# Patient Record
Sex: Female | Born: 1965 | Race: White | Hispanic: No | Marital: Married | Smoking: Never smoker
Health system: Southern US, Community
[De-identification: ages and names within clinical notes are randomized; demographics above are authoritative.]

## PROBLEM LIST (undated history)

## (undated) DIAGNOSIS — I6501 Occlusion and stenosis of right vertebral artery: Secondary | ICD-10-CM

## (undated) HISTORY — PX: APPENDECTOMY: SHX54

## (undated) HISTORY — PX: CHOLECYSTECTOMY: SHX55

## (undated) HISTORY — PX: CERVICAL SPINE SURGERY: SHX589

---

## 2021-08-10 ENCOUNTER — Encounter (HOSPITAL_COMMUNITY): Payer: Self-pay | Admitting: Emergency Medicine

## 2021-08-10 ENCOUNTER — Other Ambulatory Visit: Payer: Self-pay

## 2021-08-10 ENCOUNTER — Emergency Department (HOSPITAL_COMMUNITY): Payer: No Typology Code available for payment source

## 2021-08-10 ENCOUNTER — Emergency Department (HOSPITAL_COMMUNITY)
Admission: EM | Admit: 2021-08-10 | Discharge: 2021-08-10 | Disposition: A | Discharge status: Home / Self Care | Payer: No Typology Code available for payment source | Attending: Emergency Medicine | Admitting: Emergency Medicine

## 2021-08-10 DIAGNOSIS — Z7982 Long term (current) use of aspirin: Secondary | ICD-10-CM | POA: Diagnosis not present

## 2021-08-10 DIAGNOSIS — R42 Dizziness and giddiness: Secondary | ICD-10-CM | POA: Diagnosis not present

## 2021-08-10 DIAGNOSIS — I639 Cerebral infarction, unspecified: Secondary | ICD-10-CM | POA: Insufficient documentation

## 2021-08-10 DIAGNOSIS — R531 Weakness: Secondary | ICD-10-CM | POA: Diagnosis present

## 2021-08-10 DIAGNOSIS — Z7902 Long term (current) use of antithrombotics/antiplatelets: Secondary | ICD-10-CM | POA: Diagnosis not present

## 2021-08-10 DIAGNOSIS — R29898 Other symptoms and signs involving the musculoskeletal system: Secondary | ICD-10-CM

## 2021-08-10 HISTORY — DX: Occlusion and stenosis of right vertebral artery: I65.01

## 2021-08-10 LAB — COMPREHENSIVE METABOLIC PANEL
ALT: 32 U/L (ref 0–44)
AST: 23 U/L (ref 15–41)
Albumin: 4.3 g/dL (ref 3.5–5.0)
Alkaline Phosphatase: 76 U/L (ref 38–126)
Anion gap: 9 (ref 5–15)
BUN: 13 mg/dL (ref 6–20)
CO2: 26 mmol/L (ref 22–32)
Calcium: 9.6 mg/dL (ref 8.9–10.3)
Chloride: 104 mmol/L (ref 98–111)
Creatinine, Ser: 0.87 mg/dL (ref 0.44–1.00)
GFR, Estimated: >60 mL/min (ref >60)
Glucose, Bld: 106 mg/dL — ABNORMAL HIGH (ref 70–99)
Potassium: 4.1 mmol/L (ref 3.5–5.1)
Sodium: 139 mmol/L (ref 135–145)
Total Bilirubin: 0.6 mg/dL (ref 0.3–1.2)
Total Protein: 7.4 g/dL (ref 6.5–8.1)

## 2021-08-10 LAB — I-STAT CHEM 8, ED
BUN: 14 mg/dL (ref 6–20)
Calcium, Ion: 1.15 mmol/L (ref 1.15–1.40)
Chloride: 103 mmol/L (ref 98–111)
Creatinine, Ser: 0.8 mg/dL (ref 0.44–1.00)
Glucose, Bld: 105 mg/dL — ABNORMAL HIGH (ref 70–99)
HCT: 45 % (ref 36.0–46.0)
Hemoglobin: 15.3 g/dL — ABNORMAL HIGH (ref 12.0–15.0)
Potassium: 4 mmol/L (ref 3.5–5.1)
Sodium: 140 mmol/L (ref 135–145)
TCO2: 25 mmol/L (ref 22–32)

## 2021-08-10 LAB — CBC
HCT: 45 % (ref 36.0–46.0)
Hemoglobin: 15.1 g/dL — ABNORMAL HIGH (ref 12.0–15.0)
MCH: 28.2 pg (ref 26.0–34.0)
MCHC: 33.6 g/dL (ref 30.0–36.0)
MCV: 84.1 fL (ref 80.0–100.0)
Platelets: 232 10*3/uL (ref 150–400)
RBC: 5.35 MIL/uL — ABNORMAL HIGH (ref 3.87–5.11)
RDW: 12.4 % (ref 11.5–15.5)
WBC: 6.9 10*3/uL (ref 4.0–10.5)
nRBC: 0 % (ref 0.0–0.2)

## 2021-08-10 LAB — DIFFERENTIAL
Abs Immature Granulocytes: 0.02 10*3/uL (ref 0.00–0.07)
Basophils Absolute: 0 10*3/uL (ref 0.0–0.1)
Basophils Relative: 0 %
Eosinophils Absolute: 0 10*3/uL (ref 0.0–0.5)
Eosinophils Relative: 1 %
Immature Granulocytes: 0 %
Lymphocytes Relative: 30 %
Lymphs Abs: 2.1 10*3/uL (ref 0.7–4.0)
Monocytes Absolute: 0.3 10*3/uL (ref 0.1–1.0)
Monocytes Relative: 5 %
Neutro Abs: 4.4 10*3/uL (ref 1.7–7.7)
Neutrophils Relative %: 64 %

## 2021-08-10 LAB — PROTIME-INR
INR: 1 (ref 0.8–1.2)
Prothrombin Time: 13.1 seconds (ref 11.4–15.2)

## 2021-08-10 LAB — APTT: aPTT: 33 seconds (ref 24–36)

## 2021-08-10 LAB — I-STAT BETA HCG BLOOD, ED (MC, WL, AP ONLY): I-stat hCG, quantitative: <5 m[IU]/mL (ref <5)

## 2021-08-10 LAB — TROPONIN I (HIGH SENSITIVITY): Troponin I (High Sensitivity): <2 ng/L (ref <18)

## 2021-08-10 MED ORDER — SODIUM CHLORIDE 0.9% FLUSH: 3.0000 mL | Freq: Once | INTRAVENOUS | Status: DC

## 2021-08-10 NOTE — Discharge Instructions (Signed)
Follow-up with your neurologist, as scheduled.  Return to the ED for any further concerning symptoms.

## 2021-08-10 NOTE — ED Notes (Signed)
Pt did not respond to vital check  

## 2021-08-10 NOTE — ED Triage Notes (Signed)
Pt reports L sided weakness since waking up at 3am.  Went to bed at midnight.  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  Seen at [facility] and [place].   Reports ongoing dizziness and headache since then.  No arm drift.  VAN negative.  Went to [place] this morning and states she had a normal CT.

## 2021-08-10 NOTE — ED Provider Notes (Signed)
[facility] EMERGENCY DEPARTMENT Provider Note   CSN: [id number] Arrival date & time: 08/10/21  1030     History Chief Complaint  Patient presents with   Weakness    Anatalia Kronk is a 55 y.o. female.   Weakness Associated symptoms: no abdominal pain, no arthralgias, no chest pain, no cough, no dizziness, no dysuria, no fever, no myalgias, no nausea, no seizures, no shortness of breath and no vomiting   Patient presents for left arm weakness.  She had a recent diagnosis of vertebral artery stenosis following the cute onset of dizziness and lightheadedness.  This was 4 days ago.  She presented to her local ED at the time and subsequently was admitted at [facility].  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  No interventions were taken by IR.  She was placed on DAPT advised to follow-up with neurology.  She had resolution of her symptoms.  This morning, she had onset of left arm weakness at 3 AM.  She went to ED in [place] and underwent a CT scan.  This was reportedly normal.  She presents to the ED for further evaluation/imaging.  Currently, she reports improved symptoms of left arm weakness.  She states that still feels slightly heavy.  She denies any chest discomfort.  She has no history of ACS.    Past Medical History:  Diagnosis Date   Stenosis of right vertebral artery     There are no problems to display for this patient.   Past Surgical History:  Procedure Laterality Date   APPENDECTOMY     CERVICAL SPINE SURGERY     CHOLECYSTECTOMY       OB History   No obstetric history on file.     No family history on file.  Social History   Tobacco Use   Smoking status: Never   Smokeless tobacco: Never  Substance Use Topics   Alcohol use: Not Currently   Drug use: Not Currently    Home Medications Prior to Admission medications   Medication Sig Start Date End Date Taking? Authorizing Provider  aspirin  EC 81 MG tablet Take 81 mg by mouth daily. Swallow whole.   Yes [provider]  clopidogrel (PLAVIX) 75 MG tablet Take 75 mg by mouth daily. 08/09/21  Yes [provider]  meclizine (ANTIVERT) 25 MG tablet Take 25 mg by mouth 3 (three) times daily as needed for dizziness. 08/09/21   [provider]    Allergies    Patient has no known allergies.  Review of Systems   Review of Systems  Constitutional:  Negative for activity change, appetite change, chills, fatigue and fever.  HENT:  Negative for congestion, ear pain, rhinorrhea and sore throat.   Eyes:  Negative for pain and visual disturbance.  Respiratory:  Negative for cough, chest tightness and shortness of breath.   Cardiovascular:  Negative for chest pain and palpitations.  Gastrointestinal:  Negative for abdominal pain, nausea and vomiting.  Genitourinary:  Negative for dysuria, flank pain and hematuria.  Musculoskeletal:  Negative for arthralgias, back pain, joint swelling, myalgias and neck pain.  Skin:  Negative for color change and rash.  Neurological:  Positive for weakness (Left arm). Negative for dizziness, seizures, syncope, light-headedness and numbness.  Psychiatric/Behavioral:  Negative for confusion and decreased concentration.   All other systems reviewed and are negative.  Physical Exam Updated Vital Signs BP (!) 117/93   Pulse 65   Temp 98.1 F (  36.7 C)   Resp 16   SpO2 98%   Physical Exam Vitals and nursing note reviewed.  Constitutional:      General: She is not in acute distress.    Appearance: Normal appearance. She is well-developed and normal weight. She is not ill-appearing, toxic-appearing or diaphoretic.  HENT:     Head: Normocephalic and atraumatic.     Right Ear: External ear normal.     Left Ear: External ear normal.     Nose: Nose normal. No congestion or rhinorrhea.     Mouth/Throat:     Mouth: Mucous membranes are moist.     Pharynx: Oropharynx is clear.   Eyes:     Extraocular Movements: Extraocular movements intact.     Conjunctiva/sclera: Conjunctivae normal.  Cardiovascular:     Rate and Rhythm: Normal rate and regular rhythm.     Heart sounds: No murmur heard. Pulmonary:     Effort: Pulmonary effort is normal. No respiratory distress.     Breath sounds: Normal breath sounds.  Abdominal:     Palpations: Abdomen is soft.     Tenderness: There is no abdominal tenderness.  Musculoskeletal:        General: Normal range of motion.     Cervical back: Normal range of motion and neck supple.     Right lower leg: No edema.     Left lower leg: No edema.  Skin:    General: Skin is warm and dry.     Coloration: Skin is not jaundiced or pale.  Neurological:     Mental Status: She is alert and oriented to person, place, and time.     Cranial Nerves: No cranial nerve deficit.     Sensory: Sensory deficit (Endorses slightly diminished sensation on left arm) present.     Motor: No weakness.     Coordination: Coordination normal.  Psychiatric:        Mood and Affect: Mood normal.        Behavior: Behavior normal.        Thought Content: Thought content normal.        Judgment: Judgment normal.    ED Results / Procedures / Treatments   Labs (all labs ordered are listed, but only abnormal results are displayed) Labs Reviewed  CBC - Abnormal; Notable for the following components:      Result Value   RBC 5.35 (*)    Hemoglobin 15.1 (*)    All other components within normal limits  COMPREHENSIVE METABOLIC PANEL - Abnormal; Notable for the following components:   Glucose, Bld 106 (*)    All other components within normal limits  I-STAT CHEM 8, ED - Abnormal; Notable for the following components:   Glucose, Bld 105 (*)    Hemoglobin 15.3 (*)    All other components within normal limits  PROTIME-INR  APTT  DIFFERENTIAL  CBG MONITORING, ED  I-STAT BETA HCG BLOOD, ED ([facility], [facility], [facility] ONLY)  TROPONIN I (HIGH SENSITIVITY)  TROPONIN I (HIGH  SENSITIVITY)    EKG EKG Interpretation  Date/Time:  Sunday August 10 2021 10:42:44 EST Ventricular Rate:  84 PR Interval:  152 QRS Duration: 92 QT Interval:  384 QTC Calculation: 453 R Axis:   39 Text Interpretation: Normal sinus rhythm Possible Anterior infarct , age undetermined Abnormal ECG Confirmed by Godfrey Pick [phone]) on 08/10/2021 2:33:43 PM  Radiology MR ANGIO HEAD WO CONTRAST  Result Date: 08/10/2021 CLINICAL DATA:  Neuro deficit, acute, stroke suspected 55 y.o. female  was evaluated in triage. Patient was admitted to [facility] for a right-sided vertebral artery dissection on Friday. She was placed on DAPT and discharged home with instructions to return to the emergency department with any signs of neurologic deficits. This morning she felt as though the right side of her body had "an ice bucket jumped on it." Also felt some weakness in her left upper extremity. EXAM: MRI HEAD WITHOUT CONTRAST MRA HEAD WITHOUT CONTRAST MRA NECK WITHOUT CONTRAST TECHNIQUE: Multiplanar, multiecho pulse sequences of the brain and surrounding structures were obtained without intravenous contrast. Angiographic images of the Circle of Willis were obtained using MRA technique without intravenous contrast. Angiographic images of the neck were obtained using MRA technique without intravenous contrast. Carotid stenosis measurements (when applicable) are obtained utilizing NASCET criteria, using the distal internal carotid diameter as the denominator. COMPARISON:  None. FINDINGS: MRI HEAD FINDINGS Brain: No acute infarction, hemorrhage, hydrocephalus, extra-axial collection or mass lesion. Vascular: See below. Skull and upper cervical spine: Normal marrow signal. Sinuses/Orbits: Clear sinuses.  Unremarkable orbits. Other: No mastoid effusions. MRA HEAD FINDINGS Anterior circulation: Bilateral intracranial ICAs, MCAs, and ACAs are patent without proximal hemodynamically significant stenosis. No aneurysm  identified. Posterior circulation: The visualized intradural vertebral arteries, basilar artery, and posterior cerebral arteries are patent without proximal hemodynamically significant stenosis. Left fetal type PCA, anatomic variant. Bilateral superior cerebellar arteries and visualized PICAs are patent. Tiny infundibulum arising from the left superior cerebellar artery proximally with faintly visible tiny vessel arising from the tip. No aneurysm identified. Right posterior communicating artery is also present. MRA NECK FINDINGS Carotid system: Bilateral carotids are patent without Vertebral arteries: Bilateral vertebral arteries demonstrate normal flow related signal. No evidence of significant (greater than 50%) stenosis or significant irregularity to suggest dissection. IMPRESSION: MRI head: No evidence of acute abnormality, including acute infarct. MRA head: No large vessel occlusion or significant stenosis. MRA neck: 1. No evidence of significant (greater than 50%) stenosis. 2. Bilateral vertebral arteries demonstrate normal flow related signal without significant irregularity to suggest dissection on this noncontrast MRA. A CTA neck could provide more sensitive evaluation for subtle findings of dissection if clinically indicated. Additionally, comparison/correlation with outside imaging may be helpful if available. Electronically Signed   By: Margaretha Sheffield M.D.   On: 08/10/2021 13:22   MR ANGIO NECK WO CONTRAST  Result Date: 08/10/2021 CLINICAL DATA:  Neuro deficit, acute, stroke suspected 55 y.o. female was evaluated in triage. Patient was admitted to [facility] for a right-sided vertebral artery dissection on Friday. She was placed on DAPT and discharged home with instructions to return to the emergency department with any signs of neurologic deficits. This morning she felt as though the right side of her body had "an ice bucket jumped on it." Also felt some weakness in her left upper  extremity. EXAM: MRI HEAD WITHOUT CONTRAST MRA HEAD WITHOUT CONTRAST MRA NECK WITHOUT CONTRAST TECHNIQUE: Multiplanar, multiecho pulse sequences of the brain and surrounding structures were obtained without intravenous contrast. Angiographic images of the Circle of Willis were obtained using MRA technique without intravenous contrast. Angiographic images of the neck were obtained using MRA technique without intravenous contrast. Carotid stenosis measurements (when applicable) are obtained utilizing NASCET criteria, using the distal internal carotid diameter as the denominator. COMPARISON:  None. FINDINGS: MRI HEAD FINDINGS Brain: No acute infarction, hemorrhage, hydrocephalus, extra-axial collection or mass lesion. Vascular: See below. Skull and upper cervical spine: Normal marrow signal. Sinuses/Orbits: Clear sinuses.  Unremarkable orbits. Other: No mastoid  effusions. MRA HEAD FINDINGS Anterior circulation: Bilateral intracranial ICAs, MCAs, and ACAs are patent without proximal hemodynamically significant stenosis. No aneurysm identified. Posterior circulation: The visualized intradural vertebral arteries, basilar artery, and posterior cerebral arteries are patent without proximal hemodynamically significant stenosis. Left fetal type PCA, anatomic variant. Bilateral superior cerebellar arteries and visualized PICAs are patent. Tiny infundibulum arising from the left superior cerebellar artery proximally with faintly visible tiny vessel arising from the tip. No aneurysm identified. Right posterior communicating artery is also present. MRA NECK FINDINGS Carotid system: Bilateral carotids are patent without Vertebral arteries: Bilateral vertebral arteries demonstrate normal flow related signal. No evidence of significant (greater than 50%) stenosis or significant irregularity to suggest dissection. IMPRESSION: MRI head: No evidence of acute abnormality, including acute infarct. MRA head: No large vessel occlusion or  significant stenosis. MRA neck: 1. No evidence of significant (greater than 50%) stenosis. 2. Bilateral vertebral arteries demonstrate normal flow related signal without significant irregularity to suggest dissection on this noncontrast MRA. A CTA neck could provide more sensitive evaluation for subtle findings of dissection if clinically indicated. Additionally, comparison/correlation with outside imaging may be helpful if available. Electronically Signed   By: Margaretha Sheffield M.D.   On: 08/10/2021 13:22   MR BRAIN WO CONTRAST  Result Date: 08/10/2021 CLINICAL DATA:  Neuro deficit, acute, stroke suspected 55 y.o. female was evaluated in triage. Patient was admitted to [facility] for a right-sided vertebral artery dissection on Friday. She was placed on DAPT and discharged home with instructions to return to the emergency department with any signs of neurologic deficits. This morning she felt as though the right side of her body had "an ice bucket jumped on it." Also felt some weakness in her left upper extremity. EXAM: MRI HEAD WITHOUT CONTRAST MRA HEAD WITHOUT CONTRAST MRA NECK WITHOUT CONTRAST TECHNIQUE: Multiplanar, multiecho pulse sequences of the brain and surrounding structures were obtained without intravenous contrast. Angiographic images of the Circle of Willis were obtained using MRA technique without intravenous contrast. Angiographic images of the neck were obtained using MRA technique without intravenous contrast. Carotid stenosis measurements (when applicable) are obtained utilizing NASCET criteria, using the distal internal carotid diameter as the denominator. COMPARISON:  None. FINDINGS: MRI HEAD FINDINGS Brain: No acute infarction, hemorrhage, hydrocephalus, extra-axial collection or mass lesion. Vascular: See below. Skull and upper cervical spine: Normal marrow signal. Sinuses/Orbits: Clear sinuses.  Unremarkable orbits. Other: No mastoid effusions. MRA HEAD FINDINGS Anterior  circulation: Bilateral intracranial ICAs, MCAs, and ACAs are patent without proximal hemodynamically significant stenosis. No aneurysm identified. Posterior circulation: The visualized intradural vertebral arteries, basilar artery, and posterior cerebral arteries are patent without proximal hemodynamically significant stenosis. Left fetal type PCA, anatomic variant. Bilateral superior cerebellar arteries and visualized PICAs are patent. Tiny infundibulum arising from the left superior cerebellar artery proximally with faintly visible tiny vessel arising from the tip. No aneurysm identified. Right posterior communicating artery is also present. MRA NECK FINDINGS Carotid system: Bilateral carotids are patent without Vertebral arteries: Bilateral vertebral arteries demonstrate normal flow related signal. No evidence of significant (greater than 50%) stenosis or significant irregularity to suggest dissection. IMPRESSION: MRI head: No evidence of acute abnormality, including acute infarct. MRA head: No large vessel occlusion or significant stenosis. MRA neck: 1. No evidence of significant (greater than 50%) stenosis. 2. Bilateral vertebral arteries demonstrate normal flow related signal without significant irregularity to suggest dissection on this noncontrast MRA. A CTA neck could provide more sensitive evaluation for subtle findings of dissection if  clinically indicated. Additionally, comparison/correlation with outside imaging may be helpful if available. Electronically Signed   By: Margaretha Sheffield M.D.   On: 08/10/2021 13:22    Procedures Procedures   Medications Ordered in ED Medications  sodium chloride flush (NS) 0.9 % injection 3 mL (3 mLs Intravenous Not Given 08/10/21 1620)    ED Course  I have reviewed the triage vital signs and the nursing notes.  Pertinent labs & imaging results that were available during my care of the patient were reviewed by me and considered in my medical decision making  (see chart for details).    MDM Rules/Calculators/A&P                          Patient is a healthy 55 year old female who has undergone multiple testing and hospital visits over the past 4 days due to different neurologic symptoms.  4 days ago, the started with dizziness and lightheadedness.  She was evaluated at [facility] in [place] where she was diagnosed with a 90% stenosis of her right vertebral artery.  This morning at 3 AM, she had left arm weakness which is a new symptom for her.  She initially presented to her local hospital in [place] and subsequently came here for further imaging.  Prior to being bedded in the ED, MRI and MRA studies were obtained.  Results of the studies were unremarkable.  Vessels appear widely patent.  I did discuss these imaging findings with neurologist on-call, Dr. Curly Shores.  She did confirm that no stenosis is identified.  Patient's previous vertebral artery stenosis was diagnosed on CTA.  Finding of stenosis at that time could have been secondary to artifact.  Patient was informed of reassuring imaging results.  Given that she does have some symptoms of left arm heaviness, will obtain single troponin to rule out ACS with left arm symptoms as an anginal equivalent.  On her EKG, no evidence of ischemia is identified.  Troponin was normal.  Patient was advised to follow-up with her neurologist as planned.  She was discharged in good condition.  Final Clinical Impression(s) / ED Diagnoses Final diagnoses:  Left arm weakness    Rx / DC Orders ED Discharge Orders     None        Godfrey Pick, MD 08/10/21 1755

## 2021-08-10 NOTE — ED Provider Notes (Signed)
Emergency Medicine Provider Triage Evaluation Note  Rebecca Cortez , a 55 y.o. female  was evaluated in triage.  Patient was admitted to [facility] for a right-sided vertebral artery dissection on Friday.  She was placed on DAPT and discharged home with instructions to return to the emergency department with any signs of neurologic deficits.  This morning she felt as though the right side of her body had "an ice bucket jumped on it."  Also felt some weakness in her left upper extremity.  She works at [employer] however they do not have an MRI machine on the weekends.  She came here because she was instructed to get an MR angio with acute symptoms.  Review of Systems  Positive: Weakness and dizziness Negative: Chest pain, visual changes  Physical Exam  BP (!) 158/105 (BP Location: Left Arm)   Pulse 86   Temp 98.2 F (36.8 C) (Oral)   Resp 17   SpO2 100%  Gen:   Awake, no distress   Resp:  Normal effort  MSK:   Moves extremities without difficulty  Other:  4 out of 5 finger grip, elbow extension.  PERRLA.  Sensation intact.  NIH and VAN negative.  Medical Decision Making  Medically screening exam initiated at 11:04 AM.  Appropriate orders placed.  Saima Monterroso was informed that the remainder of the evaluation will be completed by another provider, this initial triage assessment does not replace that evaluation, and the importance of remaining in the ED until their evaluation is complete.     Saddie Benders, PA-C 08/10/21 1107    Sloan Leiter, DO 08/10/21 1514

## 2022-10-24 IMAGING — MR MR MRA HEAD W/O CM
2 series · 18 of 48 positions shown · non-contrast
Comparison: None.

CLINICAL DATA: Neuro deficit, acute, stroke suspected
TECHNIQUE: Multiplanar, multiecho pulse sequences of the brain and surrounding
structures were obtained without intravenous contrast. Angiographic
images of the Circle of Willis were obtained using MRA technique
without intravenous contrast. Angiographic images of the neck were
obtained using MRA technique without intravenous contrast. Carotid
stenosis measurements (when applicable) are obtained utilizing
NASCET criteria, using the distal internal carotid diameter as the
denominator.

[Series 2: ax (id) · axial · 1.0mm · 0.43mm/px · z∈[-90,-9]mm · 17 of 176 slices shown]
[im 1/176]
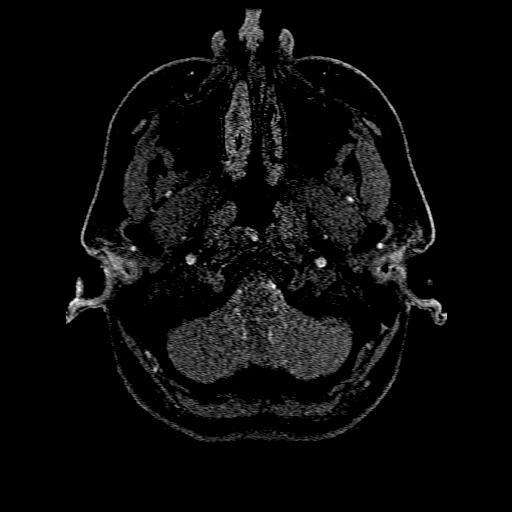
[im 4/176]
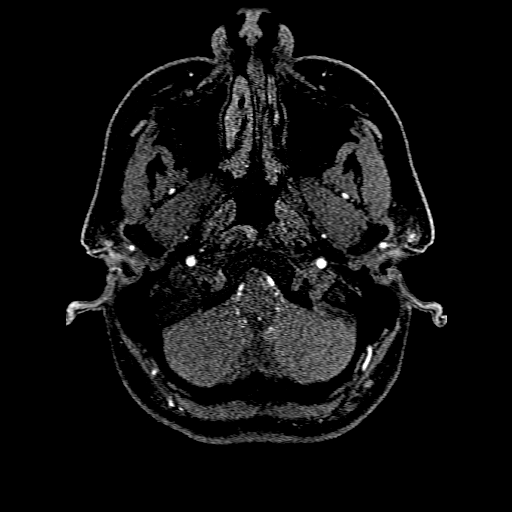
[im 8/176]
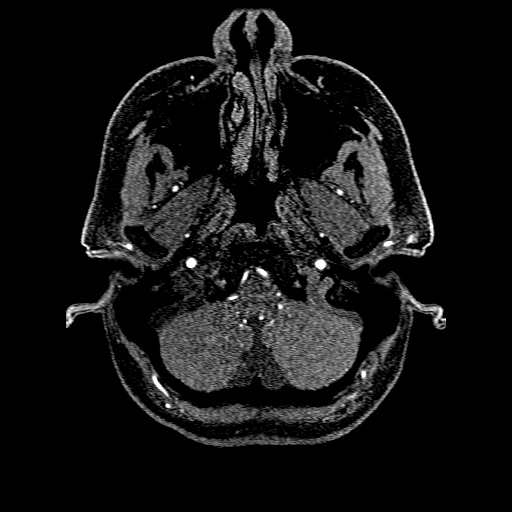
[im 12/176]
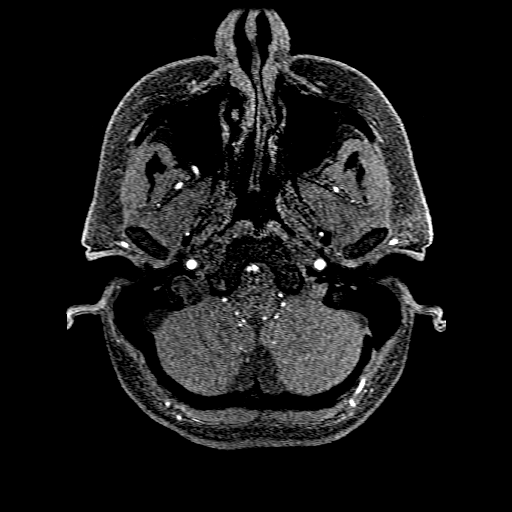
[im 16/176]
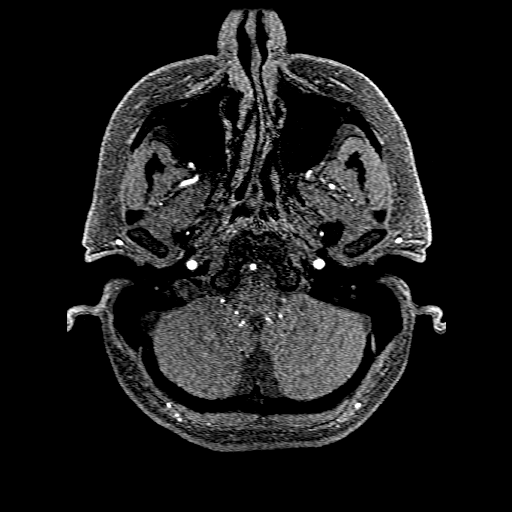
[im 20/176]
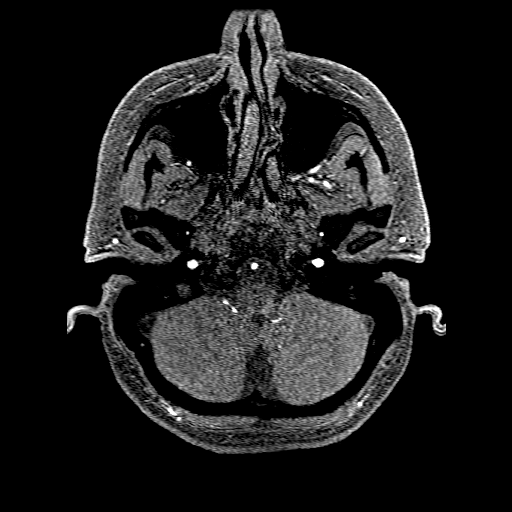
[im 23/176]
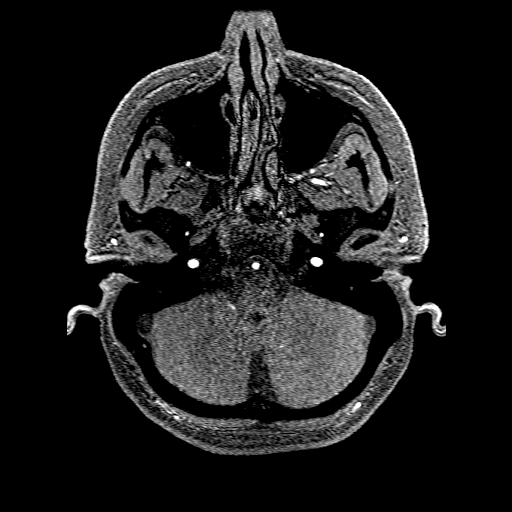
[im 27/176]
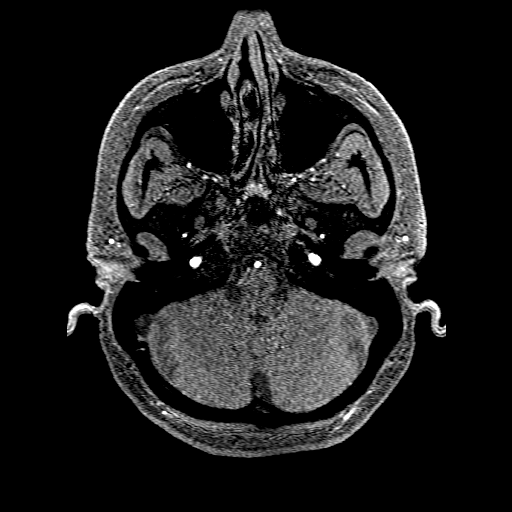
[im 31/176]
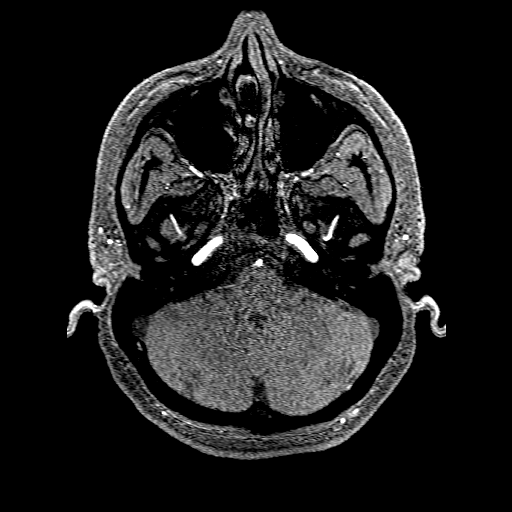
[im 54/176]
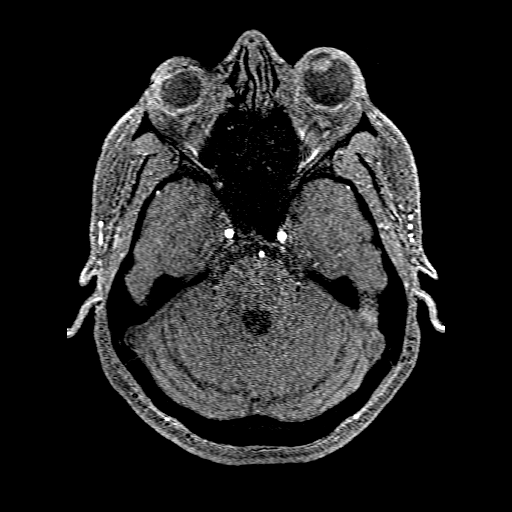
[im 77/176]
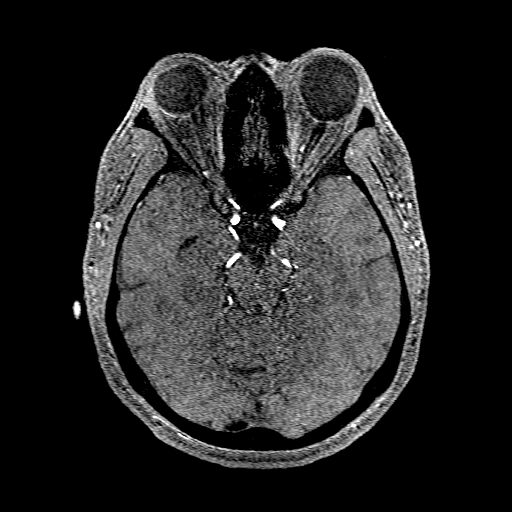
[im 88/176]
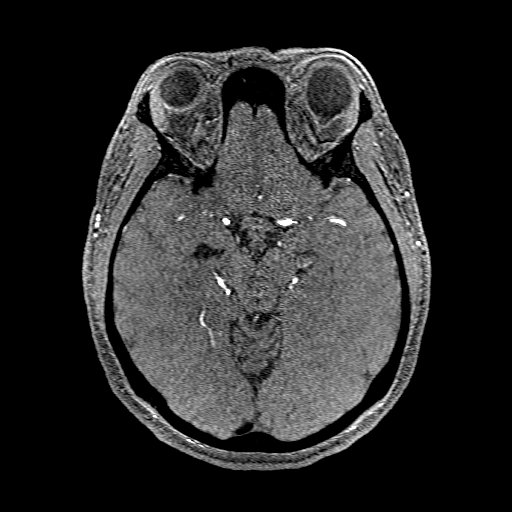
[im 99/176]
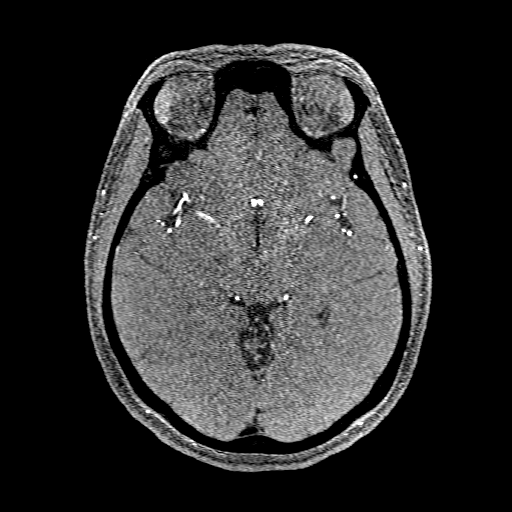
[im 122/176]
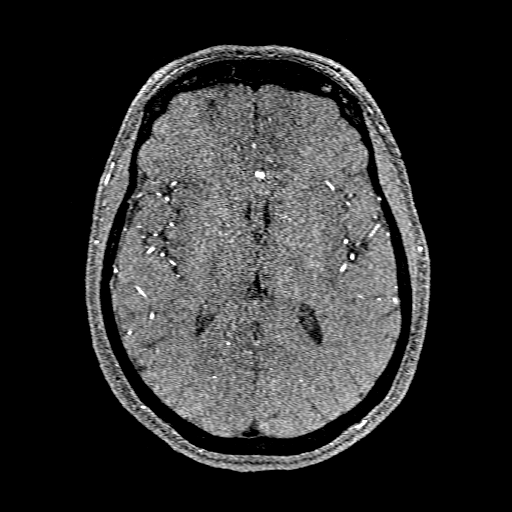
[im 145/176]
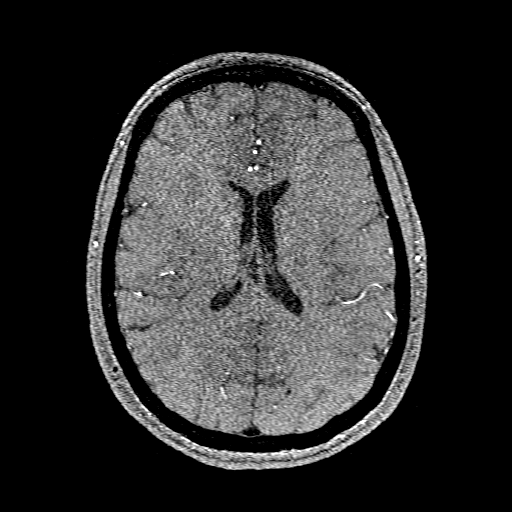
[im 149/176]
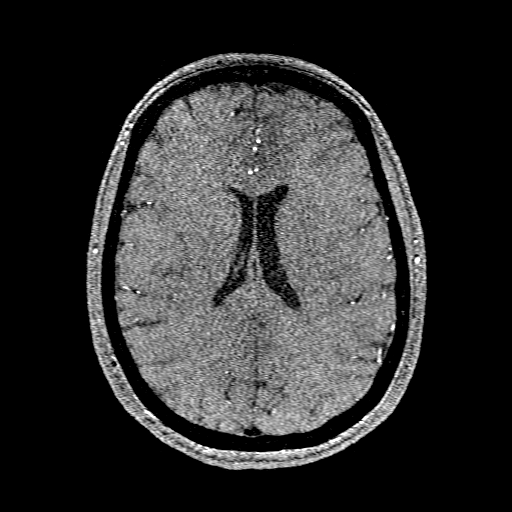
[im 168/176]
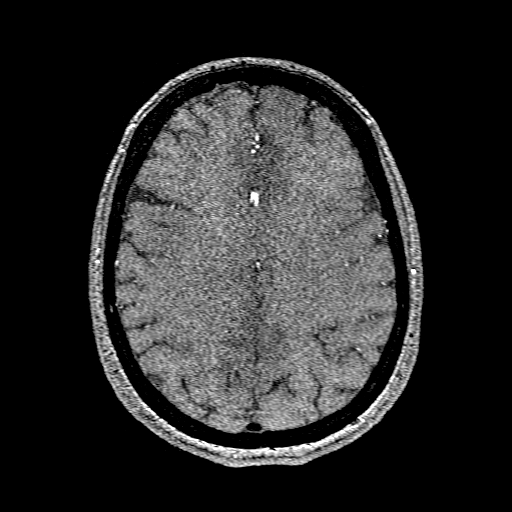

[Series 201: pjn:ax (id) · sagittal · 1.0mm · 0.43mm/px · 1 of 4 slices shown]
[im 1/4]
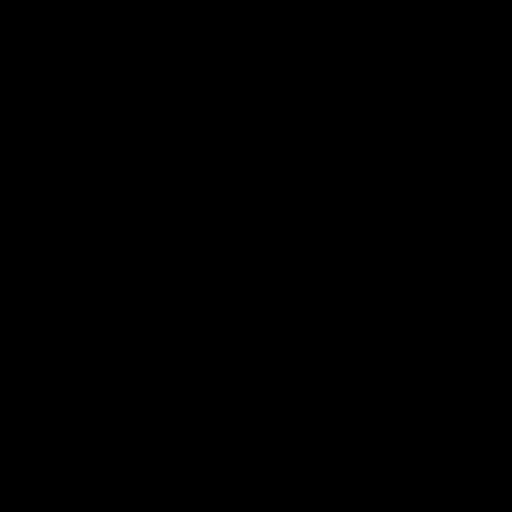

[18 of 48 positions shown; findings below may reference images not displayed]

55 y.o. female was evaluated in triage. Patient was admitted to
[facility] [[facility]OS[facility]] for a right-sided vertebral artery dissection
on [REDACTED]. She was placed on DAPT and discharged home with
instructions to return to the emergency department with any signs of
neurologic deficits. This morning she felt as though the right side
of her body had "an ice bucket jumped on it." Also felt some
weakness in her left upper extremity.

EXAM:
MRI HEAD WITHOUT CONTRAST

MRA HEAD WITHOUT CONTRAST

MRA NECK WITHOUT CONTRAST
FINDINGS: MRI HEAD FINDINGS

Brain: No acute infarction, hemorrhage, hydrocephalus, extra-axial
collection or mass lesion.

Vascular: See below.

Skull and upper cervical spine: Normal marrow signal.

Sinuses/Orbits: Clear sinuses.  Unremarkable orbits.

Other: No mastoid effusions.

MRA HEAD FINDINGS

Anterior circulation: Bilateral intracranial ICAs, MCAs, and ACAs
are patent without proximal hemodynamically significant stenosis. No
aneurysm identified.

Posterior circulation: The visualized intradural vertebral arteries,
basilar artery, and posterior cerebral arteries are patent without
proximal hemodynamically significant stenosis. Left fetal type PCA,
anatomic variant. Bilateral superior cerebellar arteries and
visualized PICAs are patent. Tiny infundibulum arising from the left
superior cerebellar artery proximally with faintly visible tiny
vessel arising from the tip. No aneurysm identified. Right posterior
communicating artery is also present.

MRA NECK FINDINGS

Carotid system: Bilateral carotids are patent without

Vertebral arteries: Bilateral vertebral arteries demonstrate normal
flow related signal. No evidence of significant (greater than 50%)
stenosis or significant irregularity to suggest dissection.
IMPRESSION: MRI head:

No evidence of acute abnormality, including acute infarct.

MRA head:

No large vessel occlusion or significant stenosis.

MRA neck:

1. No evidence of significant (greater than 50%) stenosis.
2. Bilateral vertebral arteries demonstrate normal flow related
signal without significant irregularity to suggest dissection on
this noncontrast MRA. A CTA neck could provide more sensitive
evaluation for subtle findings of dissection if clinically
indicated. Additionally, comparison/correlation with outside imaging
may be helpful if available.
